# Patient Record
Sex: Male | Born: 1945 | Race: White | Hispanic: No | Marital: Married | Smoking: Former smoker
Health system: Southern US, Community
[De-identification: ages and names within clinical notes are randomized; demographics above are authoritative.]

## PROBLEM LIST (undated history)

## (undated) DIAGNOSIS — N200 Calculus of kidney: Secondary | ICD-10-CM

## (undated) HISTORY — DX: Calculus of kidney: N20.0

## (undated) HISTORY — PX: VASECTOMY: SHX75

---

## 2010-05-24 HISTORY — PX: COLONOSCOPY: SHX174

## 2014-01-15 ENCOUNTER — Ambulatory Visit (INDEPENDENT_AMBULATORY_CARE_PROVIDER_SITE_OTHER): Payer: Medicare Other | Admitting: Family Medicine

## 2014-01-15 VITALS — BP 142/86 | HR 67 | Temp 98.2°F | Resp 16 | Ht 69.0 in | Wt 191.0 lb

## 2014-01-15 DIAGNOSIS — Z13220 Encounter for screening for lipoid disorders: Secondary | ICD-10-CM

## 2014-01-15 DIAGNOSIS — Z0000 Encounter for general adult medical examination without abnormal findings: Secondary | ICD-10-CM

## 2014-01-15 DIAGNOSIS — Z23 Encounter for immunization: Secondary | ICD-10-CM

## 2014-01-15 DIAGNOSIS — Z131 Encounter for screening for diabetes mellitus: Secondary | ICD-10-CM

## 2014-01-15 DIAGNOSIS — Z111 Encounter for screening for respiratory tuberculosis: Secondary | ICD-10-CM

## 2014-01-15 DIAGNOSIS — Z125 Encounter for screening for malignant neoplasm of prostate: Secondary | ICD-10-CM

## 2014-01-15 LAB — COMPREHENSIVE METABOLIC PANEL
ALT: 33 U/L (ref 0–53)
AST: 24 U/L (ref 0–37)
Albumin: 4.1 g/dL (ref 3.5–5.2)
Alkaline Phosphatase: 90 U/L (ref 39–117)
BILIRUBIN TOTAL: 0.7 mg/dL (ref 0.2–1.2)
BUN: 16 mg/dL (ref 6–23)
CO2: 24 mEq/L (ref 19–32)
Calcium: 9 mg/dL (ref 8.4–10.5)
Chloride: 105 mEq/L (ref 96–112)
Creat: 0.97 mg/dL (ref 0.50–1.35)
Glucose, Bld: 106 mg/dL — ABNORMAL HIGH (ref 70–99)
Potassium: 4 mEq/L (ref 3.5–5.3)
SODIUM: 137 meq/L (ref 135–145)
TOTAL PROTEIN: 6.6 g/dL (ref 6.0–8.3)

## 2014-01-15 LAB — POCT UA - MICROSCOPIC ONLY
Bacteria, U Microscopic: NEGATIVE
CASTS, UR, LPF, POC: NEGATIVE
CRYSTALS, UR, HPF, POC: NEGATIVE
EPITHELIAL CELLS, URINE PER MICROSCOPY: NEGATIVE
RBC, urine, microscopic: NEGATIVE
Yeast, UA: NEGATIVE

## 2014-01-15 LAB — POCT CBC
Granulocyte percent: 66.6 %G (ref 37–80)
HCT, POC: 46.1 % (ref 43.5–53.7)
Hemoglobin: 15.5 g/dL (ref 14.1–18.1)
LYMPH, POC: 1.3 (ref 0.6–3.4)
MCH, POC: 32.7 pg — AB (ref 27–31.2)
MCHC: 33.6 g/dL (ref 31.8–35.4)
MCV: 97.2 fL — AB (ref 80–97)
MID (cbc): 0.5 (ref 0–0.9)
MPV: 12 fL (ref 0–99.8)
POC GRANULOCYTE: 3.7 (ref 2–6.9)
POC LYMPH PERCENT: 24 %L (ref 10–50)
POC MID %: 9.4 % (ref 0–12)
Platelet Count, POC: 122 10*3/uL — AB (ref 142–424)
RBC: 4.74 M/uL (ref 4.69–6.13)
RDW, POC: 12.7 %
WBC: 5.5 10*3/uL (ref 4.6–10.2)

## 2014-01-15 LAB — LIPID PANEL
CHOL/HDL RATIO: 3.9 ratio
Cholesterol: 135 mg/dL (ref 0–200)
HDL: 35 mg/dL — ABNORMAL LOW (ref >39)
LDL CALC: 86 mg/dL (ref 0–99)
TRIGLYCERIDES: 71 mg/dL (ref <150)
VLDL: 14 mg/dL (ref 0–40)

## 2014-01-15 LAB — POCT URINALYSIS DIPSTICK
Bilirubin, UA: NEGATIVE
Glucose, UA: NEGATIVE
Ketones, UA: NEGATIVE
Leukocytes, UA: NEGATIVE
Nitrite, UA: NEGATIVE
PROTEIN UA: NEGATIVE
RBC UA: NEGATIVE
SPEC GRAV UA: 1.02
UROBILINOGEN UA: 0.2
pH, UA: 5.5

## 2014-01-15 LAB — POCT GLYCOSYLATED HEMOGLOBIN (HGB A1C): Hemoglobin A1C: 4.9

## 2014-01-15 LAB — TSH: TSH: 3.417 u[IU]/mL (ref 0.350–4.500)

## 2014-01-15 LAB — PSA: PSA: 0.96 ng/mL (ref <=4.00)

## 2014-01-15 MED ORDER — ZOSTER VACCINE LIVE 19400 UNT/0.65ML ~~LOC~~ SOLR: 0.6500 mL | Freq: Once | SUBCUTANEOUS | Status: DC

## 2014-01-15 NOTE — Progress Notes (Addendum)
Subjective:  This chart was scribed for Stephen Chick, MD by [name], ED Scribe. This patient was seen in room 13 and the patient's care was started 1:54 PM.   Patient ID: Stephen Pugh, male    DOB: 1945/12/20, 68 y.o.   MRN: [id number]  HPI HPI Comments: AZEEZ Pugh is a 68 y.o. male who presents to [facility] for a physical examination today. Pt states he will be volunteering at a camp and needs the Hepatitis B vaccine, tetanus booster, and tuberculosis testing. He states his last physical was 2 years ago with [name].   He has not received the pneumonia or shingles vaccine. He has received the flu vaccine this year.   He had a colonoscopy without polyps about 5 years ago. Buccini; repeat in 10 years.  He has been seen by an optometrist and he denies glaucoma or cataracts. He regularly sees a Education officer, community.  History of vasectomy.   Mother passed at age 33 from cardiac disease. Father passed at age 33-73 from MI. One brother is 26 with a history of high cholesterol, CABG. Second brother and sister are fairly healthy. He denies family history of colon CA.   Patient has been married for 44 years. He has 3 children and 6 grandchildren. He manages a car wash. He smoked from age 33 and quit at age 64. He occasionally drinks alcohol. Pt states he stays physically active. He always wears a seatbelt. He does not wear sunscreen regularly. Pt states he snores while sleeping and awakens feeling refreshed. He goes to sleep at 10 pm and has been waking earlier than his set alarms. He denies emotional problems such as sadness or anger. He denies weak urine stream, erectile dysfunction, or decreased sex drive.   He denies having hearing trouble or hearing loss, dizziness, lightheadedness, blurred or double vision, neck swelling, chest pain, palpitations, SOB, cough, extremity numbness or tingling, neck pain, back pain, constipation, abdominal pain, urinary frequency, testicular pain or swelling.     Tuberculosis Risk Questionnaire  1. No Were you born outside the [place] in one of the following parts of the world: [place], [place], [place], [place] or [place]?    2. No Have you traveled outside the [place] and lived for more than one month in one of the following parts of the world: [place], [place], [place], [place] or [place]?    3. No Do you have a compromised immune system such as from any of the following conditions:HIV/AIDS, organ or bone marrow transplantation, diabetes, immunosuppressive medicines (e.g. Prednisone, Remicaide), leukemia, lymphoma, cancer of the head or neck, gastrectomy or jejunal bypass, end-stage renal disease (on dialysis), or silicosis?     4. No Have you ever or do you plan on working in: a residential care center, a health care facility, a jail or prison or homeless shelter?    5. No Have you ever: injected illegal drugs, used crack cocaine, lived in a homeless shelter  or been in jail or prison?     6. No Have you ever been exposed to anyone with infectious tuberculosis?    Tuberculosis Symptom Questionnaire  Do you currently have any of the following symptoms?  1. No Unexplained cough lasting more than 3 weeks?   2. No Unexplained fever lasting more than 3 weeks.   3. No Night Sweats (sweating that leaves the bedclothes and sheets wet)     4. No Shortness of Breath   5. No Chest  Pain   6. No Unintentional weight loss    7. No Unexplained fatigue (very tired for no reason)    Review of Systems  Constitutional: Negative.   HENT: Negative for ear pain, hearing loss, postnasal drip, rhinorrhea, sinus pressure, sneezing, sore throat, tinnitus, trouble swallowing and voice change.   Eyes: Negative for redness and visual disturbance.  Respiratory: Negative for apnea, cough, shortness of breath, wheezing and stridor.   Cardiovascular: Negative for chest pain, palpitations and leg swelling.  Gastrointestinal:  Negative for nausea, vomiting, abdominal pain, diarrhea, constipation, blood in stool, abdominal distention, anal bleeding and rectal pain.  Endocrine: Negative.   Genitourinary: Negative for urgency, frequency, hematuria, flank pain, decreased urine volume, penile swelling, scrotal swelling, penile pain and testicular pain.  Musculoskeletal: Negative for arthralgias, back pain, gait problem, joint swelling, myalgias, neck pain and neck stiffness.  Skin: Negative for color change, pallor, rash and wound.  Neurological: Negative for dizziness, tremors, seizures, syncope, facial asymmetry, speech difficulty, weakness, light-headedness, numbness and headaches.  Hematological: Negative for adenopathy. Does not bruise/bleed easily.  Psychiatric/Behavioral: Negative for sleep disturbance, self-injury and dysphoric mood. The patient is not nervous/anxious.    Past Medical History  Diagnosis Date  . Kidney stones    Past Surgical History  Procedure Laterality Date  . Vasectomy    . Colonoscopy  05/24/2010    diverticulosis.  Buccini. Repeat 10 years.   No Known Allergies  History   Social History  . Marital Status: Married    Spouse Name: N/A    Number of Children: 3  . Years of Education: N/A   Occupational History  . SITE MANAGER     car wash   Social History Main Topics  . Smoking status: Former Smoker -- 1.00 packs/day for 25 years  . Smokeless tobacco: Never Used  . Alcohol Use: Yes  . Drug Use: No  . Sexual Activity: Yes   Other Topics Concern  . Not on file   Social History Narrative   Marital status: married x 44 years.      Children: 3 children; 6 grandchildren.      Lives: with wife.      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      Tobacco: former smoker; smoked x 25 years.      Alcohol: 1-2 drinks per week.      Exercise; physical job; no formal exercise program.      Seatbelts: 100%      Sunscreen: rarely    History  Substance Use Topics  .  Smoking status: Former Smoker -- 1.00 packs/day for 25 years  . Smokeless tobacco: Never Used  . Alcohol Use: Yes   Family History  Problem Relation Age of Onset  . Hypertension Mother   . Diabetes Father   . Heart disease Brother 72    CAD/CABT  . Hyperlipidemia Brother        Objective:   Physical Exam  Nursing note and vitals reviewed. Constitutional: He is oriented to person, place, and time. He appears well-developed and well-nourished. No distress.  HENT:  Head: Normocephalic and atraumatic.  Right Ear: External ear normal.  Left Ear: External ear normal.  Nose: Nose normal.  Mouth/Throat: Oropharynx is clear and moist.  Eyes: Conjunctivae and EOM are normal. Pupils are equal, round, and reactive to light.  Neck: Normal range of motion. Neck supple. No JVD present. No tracheal deviation present. No thyromegaly present.  Cardiovascular: Normal rate, regular rhythm, normal  heart sounds and intact distal pulses.  Exam reveals no gallop and no friction rub.   No murmur heard. Pulmonary/Chest: Effort normal and breath sounds normal. No stridor. No respiratory distress. He has no wheezes. He has no rales. He exhibits no tenderness.  Abdominal: Soft. Bowel sounds are normal. He exhibits no distension and no mass. There is no tenderness. There is no rebound and no guarding. Hernia confirmed negative in the right inguinal area and confirmed negative in the left inguinal area.  Genitourinary: Rectum normal, prostate normal, testes normal and penis normal. Right testis shows no mass, no swelling and no tenderness. Left testis shows no mass, no swelling and no tenderness. Circumcised.  Musculoskeletal: Normal range of motion. He exhibits no edema and no tenderness.  Lymphadenopathy:    He has no cervical adenopathy.       Right: No inguinal adenopathy present.       Left: No inguinal adenopathy present.  Neurological: He is alert and oriented to person, place, and time. He has normal  reflexes. He displays normal reflexes.  Skin: Skin is warm and dry. He is not diaphoretic.  Psychiatric: He has a normal mood and affect. His behavior is normal. Judgment and thought content normal.    Filed Vitals:   01/15/14 0834  BP: 142/86  Pulse: 67  Temp: 98.2 F (36.8 C)  TempSrc: Oral  Resp: 16  Height: 5\' 9"  (1.753 m)  Weight: 191 lb (86.637 kg)  SpO2: 99%    EKG: NSR  HEPATITIS B#1 ADMINISTERED.  TB SKIN TEST PLACED BY FABIOLA.    Assessment & Plan:   1. Routine general medical examination at a health care facility : Anticipatory guidance.  Recommend ASA 81mg  one tablet daily.  Colonoscopy UTD.  S/p Hepatitis B#1; information on Pneumovax provided; rx for Zostava provided.  Obtain labs.  2. Need for prophylactic vaccination and inoculation against viral hepatitis : s/p Hepatitis B#1; RTC one month for Hepatitis B#2.  3. Tuberculosis screening : Tb skin test placed; RTC 48-72 hours for read.  4. Need for Zostavax administration : rrx for Zostavax provided.  5. Prostate cancer screening :discussed in detail at visit; DRE performed; obtain PSA.  6. Screening for diabetes mellitus : obtain glucose, HgbA1c.  7. Screening for hyperlipidemia : obtain FLP.    Nilda Simmer, M.D.  [facility] [address] [place], [place]  [address] [phone] phone [phone] fax  I personally performed the services described in this documentation, which was scribed in my presence.  The recorded information has been reviewed and is accurate.

## 2014-01-15 NOTE — Patient Instructions (Signed)
1. RETURN IN 48-7 HOURS FOR TB SKIN TEST READING.  Pneumococcal Vaccine, Polyvalent solution for injection What is this medicine? PNEUMOCOCCAL VACCINE, POLYVALENT (NEU mo KOK al vak SEEN, pol ee VEY luhnt) is a vaccine to prevent pneumococcus bacteria infection. These bacteria are a major cause of ear infections, Strep throat infections, and serious pneumonia, meningitis, or blood infections worldwide. These vaccines help the body to produce antibodies (protective substances) that help your body defend against these bacteria. This vaccine is recommended for people 752 years of age and older with health problems. It is also recommended for all adults over 66 years old. This vaccine will not treat an infection. This medicine may be used for other purposes; ask your health care provider or pharmacist if you have questions. COMMON BRAND NAME(S): Pneumovax 23 What should I tell my health care provider before I take this medicine? They need to know if you have any of these conditions: -bleeding problems -bone marrow or organ transplant -cancer, Hodgkin's disease -fever -infection -immune system problems -low platelet count in the blood -seizures -an unusual or allergic reaction to pneumococcal vaccine, diphtheria toxoid, other vaccines, latex, other medicines, foods, dyes, or preservatives -pregnant or trying to get pregnant -breast-feeding How should I use this medicine? This vaccine is for injection into a muscle or under the skin. It is given by a health care professional. A copy of Vaccine Information Statements will be given before each vaccination. Read this sheet carefully each time. The sheet may change frequently. Talk to your pediatrician regarding the use of this medicine in children. While this drug may be prescribed for children as young as 462 years of age for selected conditions, precautions do apply. Overdosage: If you think you have taken too much of this medicine contact a poison  control center or emergency room at once. NOTE: This medicine is only for you. Do not share this medicine with others. What if I miss a dose? It is important not to miss your dose. Call your doctor or health care professional if you are unable to keep an appointment. What may interact with this medicine? -medicines for cancer chemotherapy -medicines that suppress your immune function -medicines that treat or prevent blood clots like warfarin, enoxaparin, and dalteparin -steroid medicines like prednisone or cortisone This list may not describe all possible interactions. Give your health care provider a list of all the medicines, herbs, non-prescription drugs, or dietary supplements you use. Also tell them if you smoke, drink alcohol, or use illegal drugs. Some items may interact with your medicine. What should I watch for while using this medicine? Mild fever and pain should go away in 3 days or less. Report any unusual symptoms to your doctor or health care professional. What side effects may I notice from receiving this medicine? Side effects that you should report to your doctor or health care professional as soon as possible: -allergic reactions like skin rash, itching or hives, swelling of the face, lips, or tongue -breathing problems -confused -fever over 102 degrees F -pain, tingling, numbness in the hands or feet -seizures -unusual bleeding or bruising -unusual muscle weakness Side effects that usually do not require medical attention (report to your doctor or health care professional if they continue or are bothersome): -aches and pains -diarrhea -fever of 102 degrees F or less -headache -irritable -loss of appetite -pain, tender at site where injected -trouble sleeping This list may not describe all possible side effects. Call your doctor for medical advice about side effects.  You may report side effects to FDA at 1-800-FDA-1088. Where should I keep my medicine? This does  not apply. This vaccine is given in a clinic, pharmacy, doctor's office, or other health care setting and will not be stored at home. NOTE: This sheet is a summary. It may not cover all possible information. If you have questions about this medicine, talk to your doctor, pharmacist, or health care provider.  2014, Elsevier/Gold Standard. (2008-06-27 14:32:37)

## 2014-01-17 ENCOUNTER — Ambulatory Visit (INDEPENDENT_AMBULATORY_CARE_PROVIDER_SITE_OTHER): Payer: 59

## 2014-01-17 DIAGNOSIS — Z111 Encounter for screening for respiratory tuberculosis: Secondary | ICD-10-CM

## 2014-01-17 DIAGNOSIS — Z09 Encounter for follow-up examination after completed treatment for conditions other than malignant neoplasm: Secondary | ICD-10-CM

## 2014-01-17 LAB — TB SKIN TEST
Induration: 0 mm
TB Skin Test: NEGATIVE

## 2014-01-20 ENCOUNTER — Encounter: Payer: Self-pay | Admitting: Family Medicine

## 2014-02-15 ENCOUNTER — Ambulatory Visit (INDEPENDENT_AMBULATORY_CARE_PROVIDER_SITE_OTHER): Payer: Medicare Other | Admitting: *Deleted

## 2014-02-15 DIAGNOSIS — Z23 Encounter for immunization: Secondary | ICD-10-CM

## 2014-02-15 NOTE — Progress Notes (Signed)
   Subjective:    Patient ID: Stephen Pugh, male    DOB: 02/08/1946, 68 y.o.   MRN: [id number]  HPI Pt here for 2nd hep b series   Review of Systems     Objective:   Physical Exam        Assessment & Plan:

## 2014-02-26 ENCOUNTER — Ambulatory Visit: Payer: Self-pay | Admitting: Family Medicine

## 2014-04-16 ENCOUNTER — Ambulatory Visit: Payer: Medicare Other

## 2014-04-16 ENCOUNTER — Ambulatory Visit (INDEPENDENT_AMBULATORY_CARE_PROVIDER_SITE_OTHER): Payer: Medicare Other | Admitting: Family Medicine

## 2014-04-16 ENCOUNTER — Ambulatory Visit: Payer: Self-pay

## 2014-04-16 VITALS — BP 132/80 | HR 78 | Temp 99.0°F | Resp 17 | Ht 71.5 in | Wt 194.0 lb

## 2014-04-16 DIAGNOSIS — S8010XA Contusion of unspecified lower leg, initial encounter: Secondary | ICD-10-CM

## 2014-04-16 DIAGNOSIS — M79609 Pain in unspecified limb: Secondary | ICD-10-CM

## 2014-04-16 DIAGNOSIS — L02419 Cutaneous abscess of limb, unspecified: Secondary | ICD-10-CM

## 2014-04-16 DIAGNOSIS — L03115 Cellulitis of right lower limb: Secondary | ICD-10-CM

## 2014-04-16 DIAGNOSIS — S8011XA Contusion of right lower leg, initial encounter: Secondary | ICD-10-CM

## 2014-04-16 DIAGNOSIS — M79661 Pain in right lower leg: Secondary | ICD-10-CM

## 2014-04-16 DIAGNOSIS — L03119 Cellulitis of unspecified part of limb: Secondary | ICD-10-CM

## 2014-04-16 MED ORDER — CEFTRIAXONE SODIUM 1 G IJ SOLR
1.0000 g | Freq: Once | INTRAMUSCULAR | Status: AC
  Administered 2014-04-16: 1 g via INTRAMUSCULAR

## 2014-04-16 MED ORDER — DOXYCYCLINE HYCLATE 100 MG PO CAPS: 100.0000 mg | ORAL_CAPSULE | Freq: Two times a day (BID) | ORAL | Status: DC

## 2014-04-16 NOTE — Patient Instructions (Signed)
Take the doxycycline as directed- take it with food and a glass of water.  If you are not getting better or if you are getting worse please come back and see us or call right away!

## 2014-04-16 NOTE — Progress Notes (Signed)
[facility] [address], [place] [id number] [phone]  Date:  04/16/2014   Name:  Stephen Pugh   DOB:  [birth year]   MRN:  [id number]  PCP:  Nilda SimmerSMITH,KRISTI, MD    Chief Complaint: Leg Pain and Hypertension   History of Present Illness:  Stephen Pugh is a 68 y.o. very pleasant male patient who presents with the following:  Here today with a problem with his right calf.  Also noted to have elevated BP at triage About 3.5 weeks ago he tripped getting off a boat and hurt his right lateral calf.  He had a lot of swelling and pain in the leg and bruising and swelling in the right ankel.  He used ice, rest and elevation, did not seek care but seemed to be improving.  He has now been home about 10 days and throught he was getting better- however this am the leg was painful again, he he noted some redness and pain with walking so he decided to come in  He has never been dx or on tx for BP.  His BP today is "higher than it has ever been."  He is not sure if there is a family history of HTN.    His last tetanus was in 2009.    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  He has not noted a fever or other systemic sx.  He is generally healthy    There are no active problems to display for this patient.   Past Medical History  Diagnosis Date  . Kidney stones     Past Surgical History  Procedure Laterality Date  . Vasectomy    . Colonoscopy  05/24/2010    diverticulosis.  Buccini. Repeat 10 years.    History  Substance Use Topics  . Smoking status: Former Smoker -- 1.00 packs/day for 25 years  . Smokeless tobacco: Never Used  . Alcohol Use: Yes    Family History  Problem Relation Age of Onset  . Hypertension Mother   . Diabetes Father   . Heart disease Brother 72    CAD/CABT  . Hyperlipidemia Brother     No Known Allergies  Medication list has been reviewed and updated.  Current Outpatient Prescriptions on File Prior to Visit  Medication Sig  Dispense Refill  . zoster vaccine live, PF, (ZOSTAVAX) [id number] UNT/0.65ML injection Inject 19,400 Units into the skin once.  0.65 mL  0   No current facility-administered medications on file prior to visit.    Review of Systems:  As per HPI- otherwise negative.   Physical Examination: Filed Vitals:   04/16/14 1157  BP: 166/98  Pulse: 78  Temp: 99 F (37.2 C)  Resp: 17   Filed Vitals:   04/16/14 1157  Height: 5' 11.5" (1.816 m)  Weight: 194 lb (87.998 kg)   Body mass index is 26.68 kg/(m^2). Ideal Body Weight: Weight in (lb) to have BMI = 25: 181.4  GEN: WDWN, NAD, Non-toxic, A & O x 3, looks well HEENT: Atraumatic, Normocephalic. Neck supple. No masses, No LAD. Ears and Nose: No external deformity. CV: RRR, No M/G/R. No JVD. No thrill. No extra heart sounds. PULM: CTA B, no wheezes, crackles, rhonchi. No retractions. No resp. distress. No accessory muscle use. EXTR: No c/c/e NEURO Normal gait.  PSYCH: Normally interactive. Conversant. Not depressed or anxious appearing.  Calm demeanor.  Right leg: he has a tender, warm, red area surrounding a mostly healed  abrasion on the lateral mid calf.  There is no fluctuance or thinning of the skin to suggest a drainable abscess.  The knee, ankle and foot are otherwise negative with normal joint ROM  [facility] reading (PRIMARY) by  Dr. Patsy Lageropland. Right tib/ fib: negative RIGHT TIBIA AND FIBULA - 2 VIEW  COMPARISON: None.  FINDINGS: There is no evidence of fracture or other focal bone lesions. Soft tissues are unremarkable.  IMPRESSION: Normal right tibia and fibula.  Assessment and Plan: Pain in right shin - Plan: DG Tibia/Fibula Right  Contusion of right leg - Plan: DG Tibia/Fibula Right  Cellulitis of right leg without foot - Plan: cefTRIAXone (ROCEPHIN) injection 1 g, doxycycline (VIBRAMYCIN) 100 MG capsule  Likely cellulitis of the right calf resulting for an injury nearly a month ago.  Rocephin IM today, then start  doxycycline.  Urged close follow-up if not better soon.  BP ok on recheck  Meds ordered this encounter  Medications  . aspirin 81 MG tablet    Sig: Take 81 mg by mouth daily.  . cefTRIAXone (ROCEPHIN) injection 1 g    Sig:     Order Specific Question:  Antibiotic Indication:    Answer:  Cellulitis  . doxycycline (VIBRAMYCIN) 100 MG capsule    Sig: Take 1 capsule (100 mg total) by mouth 2 (two) times daily.    Dispense:  20 capsule    Refill:  0     Signed Abbe AmsterdamJessica Copland, MD

## 2014-07-15 ENCOUNTER — Ambulatory Visit (INDEPENDENT_AMBULATORY_CARE_PROVIDER_SITE_OTHER): Payer: Medicare Other

## 2014-07-15 DIAGNOSIS — Z23 Encounter for immunization: Secondary | ICD-10-CM

## 2014-07-15 NOTE — Progress Notes (Signed)
   Subjective:    Patient ID: Stephen Pugh, male    DOB: February 07, 1946, 68 y.o.   MRN: [id number]  HPI Pt here for third Hep B vaccination  Authorized by Porfirio Oarhelle Jeffery   Review of Systems     Objective:   Physical Exam Administered Hep B vaccination       Assessment & Plan:

## 2014-11-14 ENCOUNTER — Telehealth: Payer: Self-pay

## 2014-11-14 NOTE — Telephone Encounter (Signed)
Spoke to patient regarding flu shot.  He states he had it in late September.

## 2015-01-23 ENCOUNTER — Encounter: Payer: Self-pay | Admitting: Family Medicine

## 2015-01-23 ENCOUNTER — Ambulatory Visit (INDEPENDENT_AMBULATORY_CARE_PROVIDER_SITE_OTHER): Payer: Medicare Other | Admitting: Family Medicine

## 2015-01-23 VITALS — BP 145/83 | HR 80 | Temp 98.4°F | Resp 16 | Ht 72.0 in | Wt 199.0 lb

## 2015-01-23 DIAGNOSIS — Z111 Encounter for screening for respiratory tuberculosis: Secondary | ICD-10-CM

## 2015-01-23 NOTE — Progress Notes (Signed)
   Subjective:    Patient ID: Stephen Pugh, male    DOB: 10-Feb-1946, 69 y.o.   MRN: [id number]  HPI    Review of Systems     Objective:   Physical Exam        Assessment & Plan:   Tuberculosis Risk Questionnaire  1. No Were you born outside the [place] in one of the following parts of the world: [place], [place], [place], [place] or [place]?    2. No Have you traveled outside the [place] and lived for more than one month in one of the following parts of the world: [place], [place], [place], [place] or [place]?    3. No Do you have a compromised immune system such as from any of the following conditions:HIV/AIDS, organ or bone marrow transplantation, diabetes, immunosuppressive medicines (e.g. Prednisone, Remicaide), leukemia, lymphoma, cancer of the head or neck, gastrectomy or jejunal bypass, end-stage renal disease (on dialysis), or silicosis?     4. No Have you ever or do you plan on working in: a residential care center, a health care facility, a jail or prison or homeless shelter?    5. No Have you ever: injected illegal drugs, used crack cocaine, lived in a homeless shelter  or been in jail or prison?     6. No Have you ever been exposed to anyone with infectious tuberculosis?    Tuberculosis Symptom Questionnaire  Do you currently have any of the following symptoms?  1. No Unexplained cough lasting more than 3 weeks?   2. No Unexplained fever lasting more than 3 weeks.   3. No Night Sweats (sweating that leaves the bedclothes and sheets wet)     4. No Shortness of Breath   5. No Chest Pain   6. No Unintentional weight loss    7. No Unexplained fatigue (very tired for no reason)

## 2015-01-26 ENCOUNTER — Ambulatory Visit (INDEPENDENT_AMBULATORY_CARE_PROVIDER_SITE_OTHER): Payer: Medicare Other | Admitting: *Deleted

## 2015-01-26 DIAGNOSIS — Z7689 Persons encountering health services in other specified circumstances: Secondary | ICD-10-CM

## 2015-01-26 DIAGNOSIS — Z111 Encounter for screening for respiratory tuberculosis: Secondary | ICD-10-CM

## 2015-01-26 LAB — TB SKIN TEST
INDURATION: 0 mm
TB Skin Test: NEGATIVE

## 2015-02-08 IMAGING — CR DG TIBIA/FIBULA 2V*R*
4 series · 4 of 4 positions shown · non-contrast
Comparison: None.

CLINICAL DATA: Right leg pain after fall.

EXAM:
RIGHT TIBIA AND FIBULA - 2 VIEW

[AP (1 of 2)]
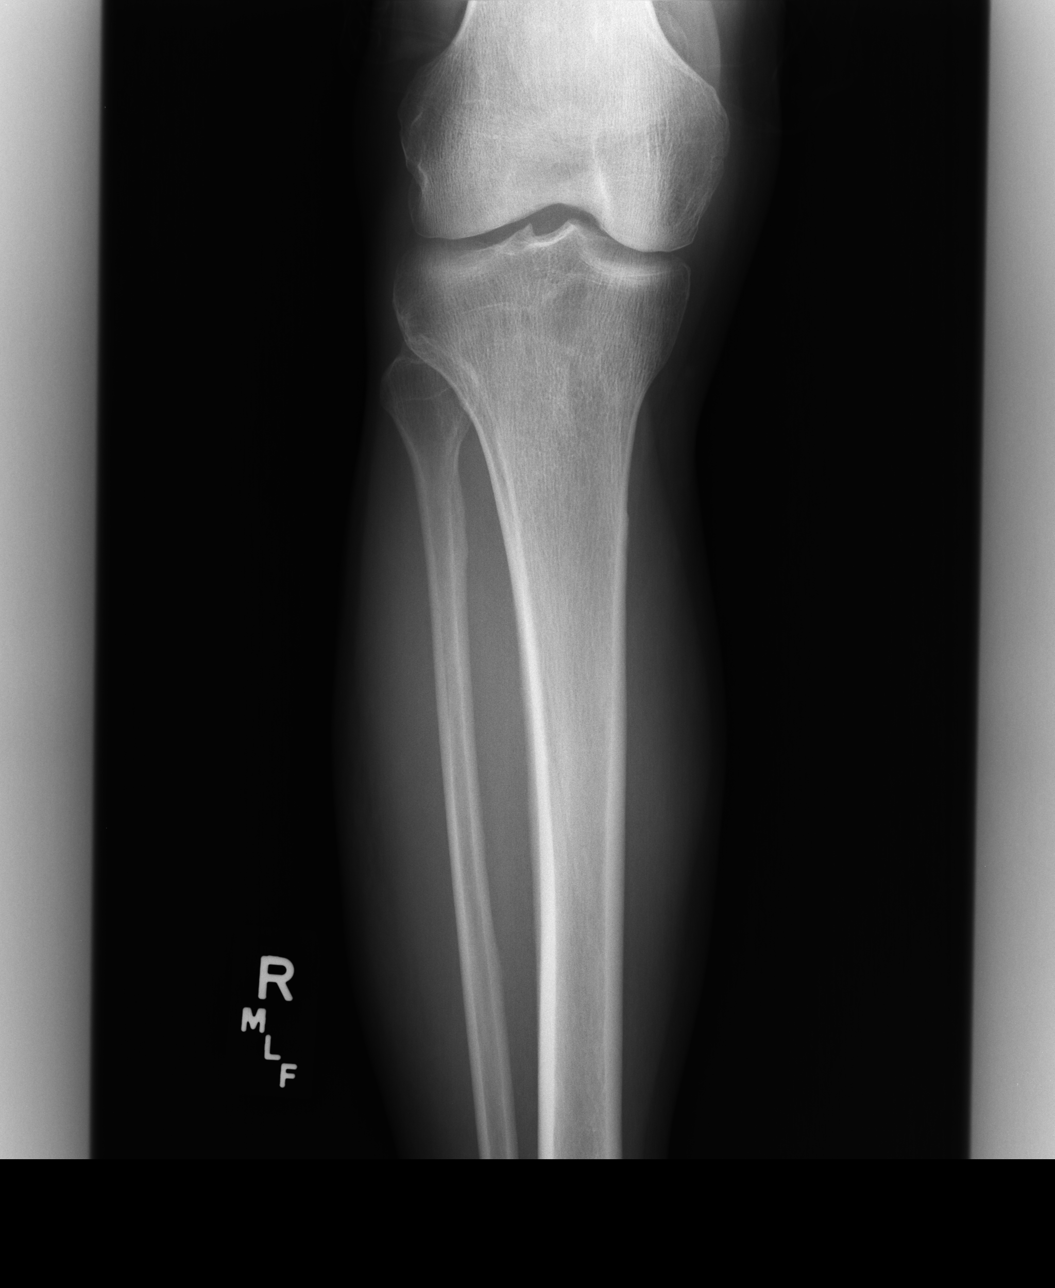

[AP (2 of 2)]
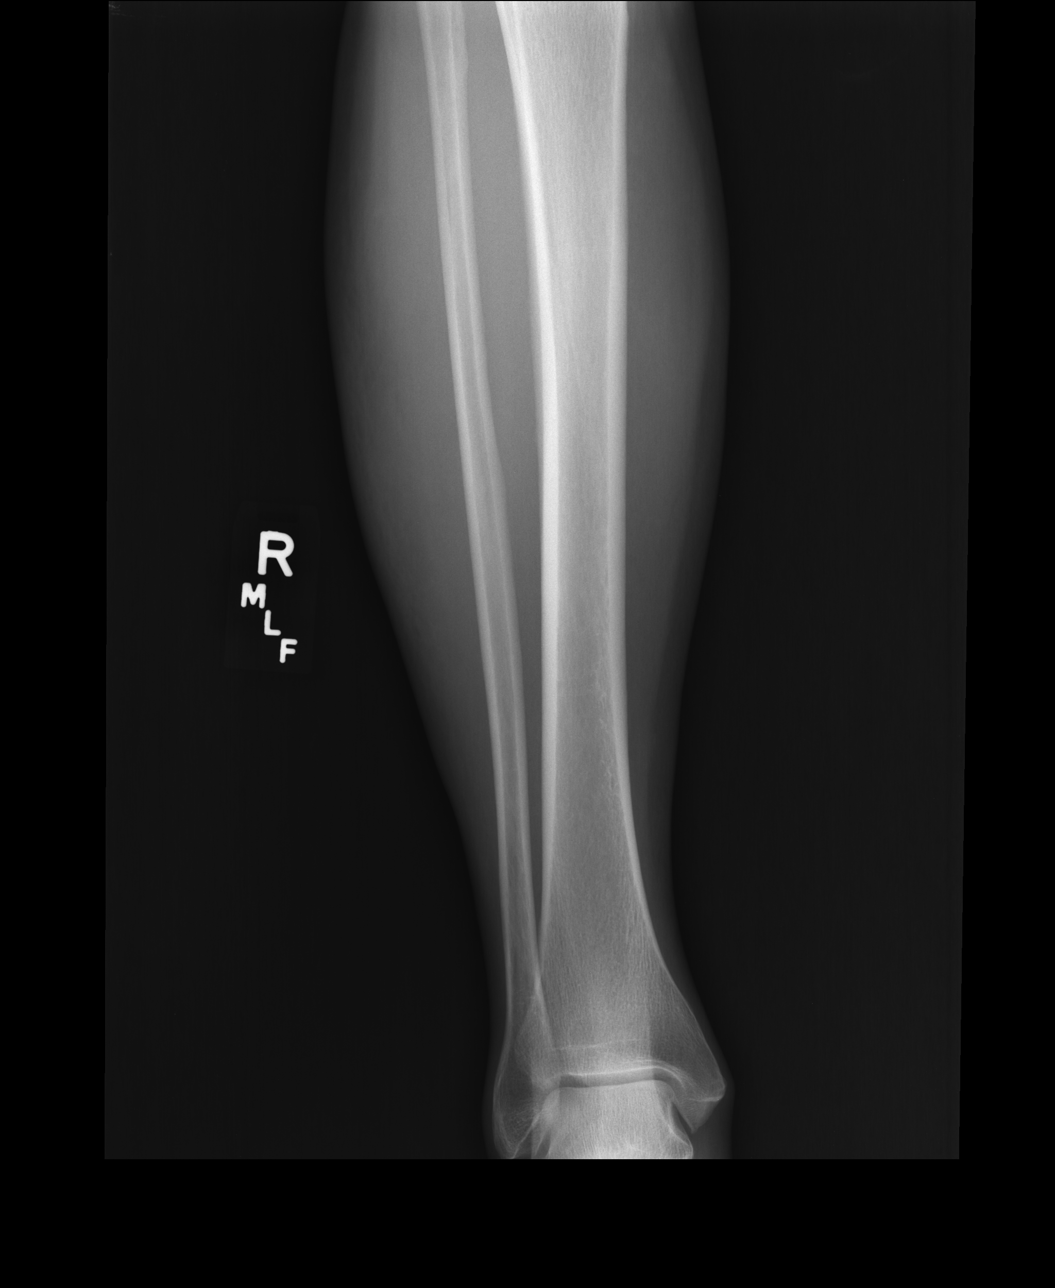

[lateral (1 of 2)]
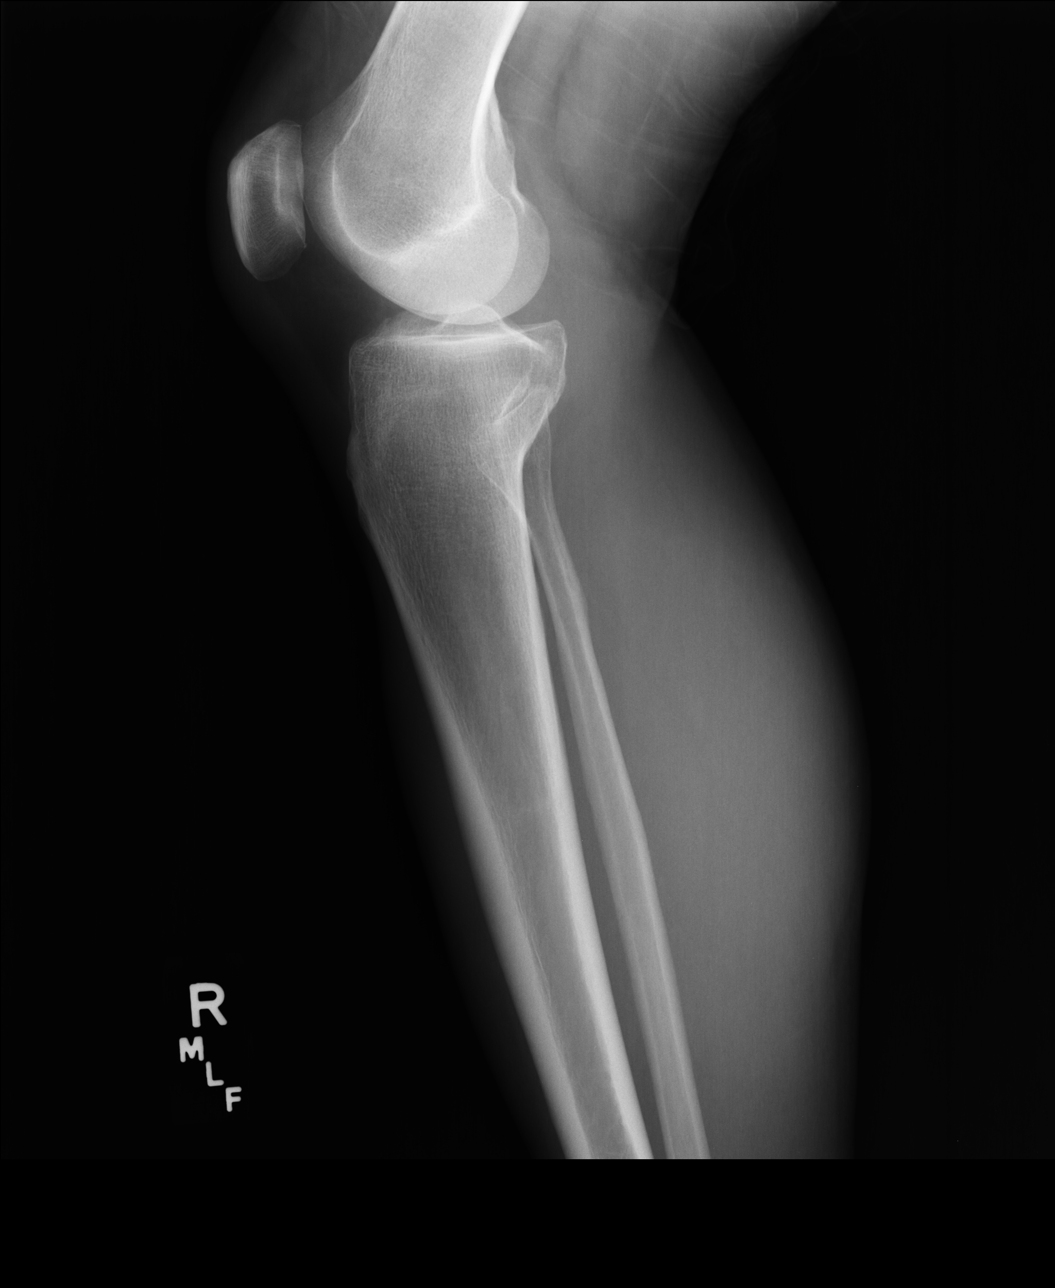

[lateral (2 of 2)]
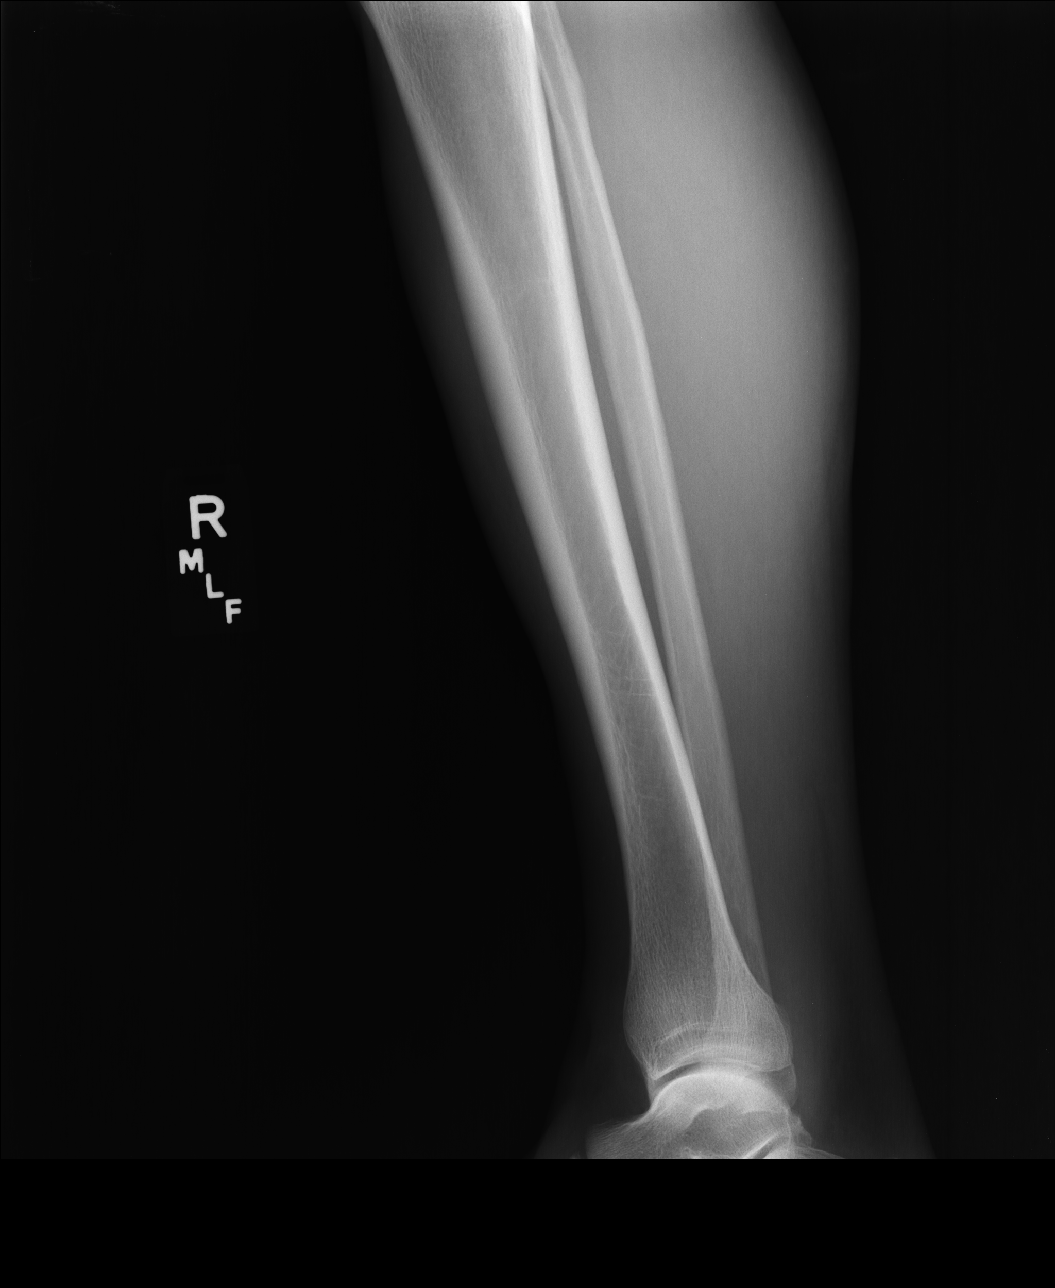

[4 of 4 positions shown; findings below may reference images not displayed]

FINDINGS: There is no evidence of fracture or other focal bone lesions. Soft
tissues are unremarkable.
IMPRESSION: Normal right tibia and fibula.

## 2015-02-08 NOTE — Progress Notes (Signed)
   Subjective:    Patient ID: Stephen Pugh, male    DOB: May 27, 1946, 69 y.o.   MRN: [id number] . Chief Complaint  Patient presents with  . 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  Has to have an annual tb test for this.  Otherwise is doing great. No complaints, no sxs.  Past Medical History  Diagnosis Date  . Kidney stones    Current Outpatient Prescriptions on File Prior to Visit  Medication Sig Dispense Refill  . aspirin 81 MG tablet Take 81 mg by mouth daily.    Marland Kitchen. doxycycline (VIBRAMYCIN) 100 MG capsule Take 1 capsule (100 mg total) by mouth 2 (two) times daily. (Patient not taking: Reported on 01/23/2015) 20 capsule 0  . zoster vaccine live, PF, (ZOSTAVAX) [id number] UNT/0.65ML injection Inject 19,400 Units into the skin once. (Patient not taking: Reported on 01/23/2015) 0.65 mL 0   No current facility-administered medications on file prior to visit.   No Known Allergies    Review of Systems  Constitutional: Negative for fever, chills, activity change, appetite change and unexpected weight change.  Respiratory: Negative for cough, chest tightness, shortness of breath and wheezing.   Gastrointestinal: Negative for nausea and vomiting.       Objective:   Physical Exam  Constitutional: He is oriented to person, place, and time. He appears well-developed and well-nourished. No distress.  HENT:  Head: Normocephalic and atraumatic.  Eyes: No scleral icterus.  Pulmonary/Chest: Effort normal.  Neurological: He is alert and oriented to person, place, and time.  Skin: Skin is warm and dry. He is not diaphoretic.  Psychiatric: He has a normal mood and affect. His behavior is normal.       Assessment & Plan:  Screening-pulmonary TB - Plan: TB Skin Test So he can volunteer at a camp for people with disabilites and illnesses. No h/o +.   Recheck in 48-72 hrs   Norberto SorensonEva Aadit Hagood, MD MPH

## 2015-02-11 ENCOUNTER — Ambulatory Visit (INDEPENDENT_AMBULATORY_CARE_PROVIDER_SITE_OTHER): Payer: Medicare Other | Admitting: Family Medicine

## 2015-02-11 ENCOUNTER — Encounter: Payer: Self-pay | Admitting: Family Medicine

## 2015-02-11 VITALS — BP 158/82 | HR 63 | Temp 98.2°F | Resp 16 | Ht 71.75 in | Wt 197.8 lb

## 2015-02-11 DIAGNOSIS — Z0000 Encounter for general adult medical examination without abnormal findings: Secondary | ICD-10-CM

## 2015-02-11 DIAGNOSIS — R030 Elevated blood-pressure reading, without diagnosis of hypertension: Secondary | ICD-10-CM | POA: Diagnosis not present

## 2015-02-11 DIAGNOSIS — R739 Hyperglycemia, unspecified: Secondary | ICD-10-CM | POA: Diagnosis not present

## 2015-02-11 DIAGNOSIS — Z23 Encounter for immunization: Secondary | ICD-10-CM

## 2015-02-11 DIAGNOSIS — Z125 Encounter for screening for malignant neoplasm of prostate: Secondary | ICD-10-CM | POA: Diagnosis not present

## 2015-02-11 DIAGNOSIS — D696 Thrombocytopenia, unspecified: Secondary | ICD-10-CM

## 2015-02-11 DIAGNOSIS — IMO0001 Reserved for inherently not codable concepts without codable children: Secondary | ICD-10-CM

## 2015-02-11 LAB — COMPREHENSIVE METABOLIC PANEL
ALBUMIN: 4.3 g/dL (ref 3.5–5.2)
ALK PHOS: 76 U/L (ref 39–117)
ALT: 42 U/L (ref 0–53)
AST: 28 U/L (ref 0–37)
BILIRUBIN TOTAL: 1.3 mg/dL — AB (ref 0.2–1.2)
BUN: 15 mg/dL (ref 6–23)
CALCIUM: 9.1 mg/dL (ref 8.4–10.5)
CHLORIDE: 105 meq/L (ref 96–112)
CO2: 22 mEq/L (ref 19–32)
Creat: 0.97 mg/dL (ref 0.50–1.35)
GLUCOSE: 81 mg/dL (ref 70–99)
POTASSIUM: 3.9 meq/L (ref 3.5–5.3)
Sodium: 139 mEq/L (ref 135–145)
TOTAL PROTEIN: 6.7 g/dL (ref 6.0–8.3)

## 2015-02-11 LAB — CBC WITH DIFFERENTIAL/PLATELET
BASOS ABS: 0.1 10*3/uL (ref 0.0–0.1)
Basophils Relative: 1 % (ref 0–1)
EOS ABS: 0.2 10*3/uL (ref 0.0–0.7)
Eosinophils Relative: 2 % (ref 0–5)
HCT: 43.2 % (ref 39.0–52.0)
Hemoglobin: 15.2 g/dL (ref 13.0–17.0)
LYMPHS ABS: 1.9 10*3/uL (ref 0.7–4.0)
Lymphocytes Relative: 24 % (ref 12–46)
MCH: 32.3 pg (ref 26.0–34.0)
MCHC: 35.2 g/dL (ref 30.0–36.0)
MCV: 91.9 fL (ref 78.0–100.0)
MPV: 12.2 fL (ref 8.6–12.4)
Monocytes Absolute: 0.7 10*3/uL (ref 0.1–1.0)
Monocytes Relative: 9 % (ref 3–12)
NEUTROS ABS: 5.1 10*3/uL (ref 1.7–7.7)
Neutrophils Relative %: 64 % (ref 43–77)
Platelets: 153 10*3/uL (ref 150–400)
RBC: 4.7 MIL/uL (ref 4.22–5.81)
RDW: 13.6 % (ref 11.5–15.5)
WBC: 7.9 10*3/uL (ref 4.0–10.5)

## 2015-02-11 NOTE — Patient Instructions (Addendum)
1. Start aspirin 81mg  one tablet daily. 2.  Recommend 10 pound weight loss. 3.  Start walking 2 miles daily. 4.  Check blood pressure every month at pharmacy. 5.  Recommend developing advanced directives (Living Will)  Keeping you healthy  Get these tests  Blood pressure- Have your blood pressure checked once a year by your healthcare provider.  Normal blood pressure is 120/80  Weight- Have your body mass index (BMI) calculated to screen for obesity.  BMI is a measure of body fat based on height and weight. You can also calculate your own BMI at [email or web address].[email or web address]/[email or web address]/.  Cholesterol- Have your cholesterol checked every year.  Diabetes- Have your blood sugar checked regularly if you have high blood pressure, high cholesterol, have a family history of diabetes or if you are overweight.  Screening for Colon Cancer- Colonoscopy starting at age 67.  Screening may begin sooner depending on your family history and other health conditions. Follow up colonoscopy as directed by your Gastroenterologist.  Screening for Prostate Cancer- Both blood work (PSA) and a rectal exam help screen for Prostate Cancer.  Screening begins at age 67 with African-American men and at age 67 with Caucasian men.  Screening may begin sooner depending on your family history.  Take these medicines  Aspirin- One aspirin daily can help prevent Heart disease and Stroke.  Flu shot- Every fall.  Tetanus- Every 10 years.  Zostavax- Once after the age of 67 to prevent Shingles.  Pneumonia shot- Once after the age of 67; if you are younger than 7165, ask your healthcare provider if you need a Pneumonia shot.  Take these steps  Don't smoke- If you do smoke, talk to your doctor about quitting.  For tips on how to quit, go to www.smokefree.gov or call 1-800-QUIT-NOW.  Be physically active- Exercise 5 days a week for at least 30 minutes.  If you are not already physically active start slow and gradually work up to 30 minutes  of moderate physical activity.  Examples of moderate activity include walking briskly, mowing the yard, dancing, swimming, bicycling, etc.  Eat a healthy diet- Eat a variety of healthy food such as fruits, vegetables, low fat milk, low fat cheese, yogurt, lean meant, poultry, fish, beans, tofu, etc. For more information go to www.thenutritionsource.org  Drink alcohol in moderation- Limit alcohol intake to less than two drinks a day. Never drink and drive.  Dentist- Brush and floss twice daily; visit your dentist twice a year.  Depression- Your emotional health is as important as your physical health. If you're feeling down, or losing interest in things you would normally enjoy please talk to your healthcare provider.  Eye exam- Visit your eye doctor every year.  Safe sex- If you may be exposed to a sexually transmitted infection, use a condom.  Seat belts- Seat belts can save your life; always wear one.  Smoke/Carbon Monoxide detectors- These detectors need to be installed on the appropriate level of your home.  Replace batteries at least once a year.  Skin cancer- When out in the sun, cover up and use sunscreen 15 SPF or higher.  Violence- If anyone is threatening you, please tell your healthcare provider.  Living Will/ Health care power of attorney- Speak with your healthcare provider and family.

## 2015-02-11 NOTE — Progress Notes (Signed)
Subjective:    Patient ID: Stephen Pugh, male    DOB: 09-17-1946, 69 y.o.   MRN: [id number]  02/11/2015  Annual Exam and Knee Pain   HPI This 69 y.o. male presents for Annual Wellness Examination.  Last physical:  01-15-2014 Colonoscopy:  2010; repeat in ten years.  Buccini. TDAP: 2009 Pneumovax: never; agreeable Zostavax: prescribed; declined.  Not interested.   Influenza:  09-03-14 Eye exam:  2015; no glaucoma or cataracts. Dental exam:  Every six months.   Hyperglycemia:  Present at CPE last year.  Working on exercise and weight loss.  Due for repeat labs.  Thrombocytopenia:  Present at last CPE one year ago.  Denies bruising or bleeding.  Denies epistaxis, gums bleeding, hematuria, or bloody stools.    R Knee pain:  Occurred two years ago; twinges of pain initially; recurrent symptoms; bought brace for two weeks with improvement.  Recurrent pain sporadically.  Now wearing knee brace for preventatively.    [facility]; children comes during weekends in Spring and Fall.  Started playing in garage.  Gardening.  Walking some.  Walking one mile twice weekly.    Review of Systems  Constitutional: Negative for fever, chills, diaphoresis, activity change, appetite change, fatigue and unexpected weight change.  HENT: Negative for congestion, dental problem, drooling, ear discharge, ear pain, facial swelling, hearing loss, mouth sores, nosebleeds, postnasal drip, rhinorrhea, sinus pressure, sneezing, sore throat, tinnitus, trouble swallowing and voice change.   Eyes: Negative for photophobia, pain, discharge, redness, itching and visual disturbance.  Respiratory: Negative for apnea, cough, choking, chest tightness, shortness of breath, wheezing and stridor.   Cardiovascular: Negative for chest pain, palpitations and leg swelling.  Gastrointestinal: Negative for nausea, vomiting, abdominal pain, diarrhea, constipation and blood in stool.  Endocrine: Negative for cold  intolerance, heat intolerance, polydipsia, polyphagia and polyuria.  Genitourinary: Negative for dysuria, urgency, frequency, hematuria, flank pain, decreased urine volume, discharge, penile swelling, scrotal swelling, enuresis, difficulty urinating, genital sores, penile pain and testicular pain.  Musculoskeletal: Positive for arthralgias. Negative for myalgias, back pain, joint swelling, gait problem, neck pain and neck stiffness.  Skin: Negative for color change, pallor, rash and wound.  Allergic/Immunologic: Negative for environmental allergies, food allergies and immunocompromised state.  Neurological: Negative for dizziness, tremors, seizures, syncope, facial asymmetry, speech difficulty, weakness, light-headedness, numbness and headaches.  Hematological: Negative for adenopathy. Does not bruise/bleed easily.  Psychiatric/Behavioral: Negative for suicidal ideas, hallucinations, behavioral problems, confusion, sleep disturbance, self-injury, dysphoric mood, decreased concentration and agitation. The patient is not nervous/anxious and is not hyperactive.     Past Medical History  Diagnosis Date  . Kidney stones    Past Surgical History  Procedure Laterality Date  . Vasectomy    . Colonoscopy  05/24/2010    diverticulosis.  Buccini. Repeat 10 years.   No Known Allergies Current Outpatient Prescriptions  Medication Sig Dispense Refill  . aspirin 81 MG tablet Take 81 mg by mouth daily.    Marland Kitchen. zoster vaccine live, PF, (ZOSTAVAX) [id number] UNT/0.65ML injection Inject 19,400 Units into the skin once. (Patient not taking: Reported on 01/23/2015) 0.65 mL 0   No current facility-administered medications for this visit.   History   Social History  . Marital Status: Married    Spouse Name: Stephen Pugh  . Number of Children: 3  . Years of Education: Stephen Pugh   Occupational History  . SITE MANAGER     [employer]   Social History Main Topics  . Smoking status: Former  Smoker -- 1.00 packs/day for 25 years  .  Smokeless tobacco: Never Used  . Alcohol Use: Yes  . Drug Use: No  . Sexual Activity: Yes   Other Topics Concern  . Not on file   Social History Narrative   Marital status: married x 45 years.      Children: 3 children; 6 grandchildren.      Lives: with wife.      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      Tobacco: former smoker; smoked x 25 years.      Alcohol: 2-3 drinks per week.      Exercise; physical job; no formal exercise program.      Seatbelts: 100%      Sunscreen: rarely      ADLs: independent with ADLs; no assistant devices.        Advanced Directives:  Has not completed.  FULL CODE; no prolonged measures.     Family History  Problem Relation Age of Onset  . Hypertension Mother   . Diabetes Father   . Heart disease Brother 72    CAD/CABG  . Hyperlipidemia Brother   . Hypertension Brother   . Alcohol abuse Sister         Objective:    BP 158/82 mmHg  Pulse 63  Temp(Src) 98.2 F (36.8 C) (Oral)  Resp 16  Ht 5' 11.75" (1.822 m)  Wt 197 lb 12.8 oz (89.721 kg)  BMI 27.03 kg/m2  SpO2 100% Physical Exam  Constitutional: He is oriented to person, place, and time. He appears well-developed and well-nourished. No distress.  HENT:  Head: Normocephalic and atraumatic.  Right Ear: External ear normal.  Left Ear: External ear normal.  Nose: Nose normal.  Mouth/Throat: Oropharynx is clear and moist.  Eyes: Conjunctivae and EOM are normal. Pupils are equal, round, and reactive to light.  Neck: Normal range of motion. Neck supple. Carotid bruit is not present. No thyromegaly present.  Cardiovascular: Normal rate, regular rhythm, normal heart sounds and intact distal pulses.  Exam reveals no gallop and no friction rub.   No murmur heard. Pulmonary/Chest: Effort normal and breath sounds normal. He has no wheezes. He has no rales.  Abdominal: Soft. Bowel sounds are normal. He exhibits no distension and no mass. There is no tenderness. There is no  rebound and no guarding. Hernia confirmed negative in the right inguinal area and confirmed negative in the left inguinal area.  Genitourinary: Rectum normal, prostate normal, testes normal and penis normal. Right testis shows no mass, no swelling and no tenderness. Left testis shows no mass, no swelling and no tenderness. Circumcised.  Musculoskeletal:       Right shoulder: Normal.       Left shoulder: Normal.       Cervical back: Normal.  Lymphadenopathy:    He has no cervical adenopathy.       Right: No inguinal adenopathy present.       Left: No inguinal adenopathy present.  Neurological: He is alert and oriented to person, place, and time. He has normal reflexes. No cranial nerve deficit. He exhibits normal muscle tone. Coordination normal.  Skin: Skin is warm and dry. No rash noted. He is not diaphoretic.  Psychiatric: He has a normal mood and affect. His behavior is normal. Judgment and thought content normal.   PREVNAR-13 ADMINISTERED.     Assessment & Plan:   1. Encounter for Medicare annual wellness exam   2. Thrombocytopenia  3. Hyperglycemia   4. Screening for prostate cancer   5. Blood pressure elevated   6. Need for pneumococcal vaccination     1.  Annual Wellness Examination and complete physical examination: anticipatory guidance provided --- weight loss, exercise, ASA  daily.  Colonoscopy UTD.  S/p Prevnar 13; pt declined Zostavax today.  Low fall risk. No hearing loss. NO URINARY INCONTINENCE.  No evidence of depression.  Does not have Living Will but plans to complete; desires full code. 2.  Thrombocytopenia: New.  Obtain CBC.  Asymptomatic. 3.  Hyperglycemia: present at CPE last year; obtain glucose, HgbA1c. Recommend weight loss, exercise, dietary modification. 4. Screening prostate cancer: s/p DRE; obtain PSA. 5.  Blood pressure elevated: recommend weight loss, exercise, low-sodium food choices; recommend checking BP once monthly.  RTC six months for BP  recheck. 6.  S/p Prevnar 13.   No orders of the defined types were placed in this encounter.    Return in about 6 months (around 08/14/2015) for recheck blood pressure.     Cortny Bambach Paulita Fujita, M.D. [facility] [address] [place], [place]  [address] [phone] phone [phone] fax

## 2015-02-12 LAB — PSA: PSA: 1.17 ng/mL (ref <=4.00)

## 2015-02-12 LAB — HEMOGLOBIN A1C
Hgb A1c MFr Bld: 5.3 % (ref <5.7)
Mean Plasma Glucose: 105 mg/dL (ref <117)

## 2015-03-11 ENCOUNTER — Ambulatory Visit (INDEPENDENT_AMBULATORY_CARE_PROVIDER_SITE_OTHER): Payer: Medicare Other | Admitting: Family Medicine

## 2015-03-11 VITALS — BP 132/82 | HR 64 | Temp 98.1°F | Resp 18 | Ht 71.5 in | Wt 194.0 lb

## 2015-03-11 DIAGNOSIS — M6530 Trigger finger, unspecified finger: Secondary | ICD-10-CM | POA: Diagnosis not present

## 2015-03-11 MED ORDER — TRIAMCINOLONE ACETONIDE 40 MG/ML IJ SUSP
40.0000 mg | Freq: Once | INTRAMUSCULAR | Status: AC
  Administered 2015-03-11: 10 mg via INTRAMUSCULAR

## 2015-03-11 NOTE — Patient Instructions (Signed)
Apply ice a couple of times today and tomorrow  Ibuprofen or Aleve if needed for pain  Wear the splint for 7-10 days. If not improving contact me and I will make referral to orthopedic hand  Return at any time if additional concern

## 2015-03-11 NOTE — Progress Notes (Signed)
Subjective: 69 year old retired gentleman who has been having a popping in his left thumb over the last several weeks. He noticed it when he awoke one morning, and it is continued to persist. No known trauma or excessive repetitive use that might have injured it. He does his yard work.  Objective: Healthy-appearing man in no major distress. He has a popping in the left thumb extensor tendon area, with the polyp palpable readily but cannot feel a defined nodule.  Assessment: Trigger finger left thumb  Plan: Discussed treatment options. Decided to go ahead and inject and splint it for a week or 10 days.  Procedure note: Area was prepped in a routine fashion. The area was anesthetized with ethyl chloride spray. Using a 27-gauge needle 10 mg of Kenalog with one fourth mL of 2% lidocaine were injected without difficulty. Patient tolerated the procedure well. Splint was applied. He has been instructed in care.  Plan: If he is doing well he will let me know. If he continues to have problems he is to contact me and I will go ahead and make a referral to orthopedic hand specialist without me needing to reexamine it.

## 2015-06-03 ENCOUNTER — Encounter: Payer: Self-pay | Admitting: Family Medicine

## 2015-08-17 ENCOUNTER — Encounter: Payer: Self-pay | Admitting: Family Medicine

## 2015-08-17 ENCOUNTER — Ambulatory Visit (INDEPENDENT_AMBULATORY_CARE_PROVIDER_SITE_OTHER): Payer: Medicare Other | Admitting: Family Medicine

## 2015-08-17 VITALS — BP 140/80 | HR 71 | Temp 97.4°F | Resp 16 | Ht 72.5 in | Wt 193.8 lb

## 2015-08-17 DIAGNOSIS — I10 Essential (primary) hypertension: Secondary | ICD-10-CM

## 2015-08-17 DIAGNOSIS — Z23 Encounter for immunization: Secondary | ICD-10-CM | POA: Diagnosis not present

## 2015-08-17 DIAGNOSIS — R0989 Other specified symptoms and signs involving the circulatory and respiratory systems: Secondary | ICD-10-CM

## 2015-08-17 NOTE — Patient Instructions (Signed)

## 2015-08-17 NOTE — Progress Notes (Signed)
Subjective:    Patient ID: Stephen Pugh, male    DOB: 08-26-1946, 69 y.o.   MRN: [id number]  08/17/2015  follow up blood pressure   HPI This 69 y.o. male presents for evaluation of elevated blood pressure.  Weight down seven pounds from last visit six months ago.  Today, L and R arm BP significantly different by 30 points. Yesterday at home, BP 128/78.  [facility]ing [facility] at [facility]; running 140-150s; doing the L arm at [facility].  One less than 140.  Has been doing some reading; s/p six month trial of weight loss, exercise, etc.  Started taking Magnesium nine days ago; not sure if very helpful.  Stephen Reges, MD wrote a book called the Magnesium Miracle. Taking a liquid magnesium Remag that is concentrated; eight ounce bottle is absorable at the cellular level without diuretic effect.  Recommends putting in water and sipping during the day.  Start out slowly at low doses and gradually increase.  Plans to work up to a teaspoon.  Exercising 3 days per week one hour; walking mostly.  Was going to the community center in [place].   Was doing some stationary bike some as well.    Brother with CABG; also had carotid bruit on R?  Has eaten poorly with high fat and high cholesterol diet.  Sedentary lifestyle; high stressful work environment.     Review of Systems  Constitutional: Negative for fever, chills, diaphoresis, activity change, appetite change and fatigue.  Eyes: Negative for visual disturbance.  Respiratory: Negative for cough and shortness of breath.   Cardiovascular: Negative for chest pain, palpitations and leg swelling.  Endocrine: Negative for cold intolerance, heat intolerance, polydipsia, polyphagia and polyuria.  Neurological: Negative for dizziness, tremors, seizures, syncope, facial asymmetry, speech difficulty, weakness, light-headedness, numbness and headaches.    Past Medical History  Diagnosis Date  . Kidney stones    Past Surgical History  Procedure Laterality Date  . Vasectomy      . Colonoscopy  05/24/2010    diverticulosis.  Buccini. Repeat 10 years.   No Known Allergies Current Outpatient Prescriptions  Medication Sig Dispense Refill  . aspirin 81 MG tablet Take 81 mg by mouth daily.     No current facility-administered medications for this visit.   Social History   Social History  . Marital Status: Married    Spouse Name: Stephen Pugh  . Number of Children: 3  . Years of Education: [place] Pugh   Occupational History  . SITE MANAGER     [employer]   Social History Main Topics  . Smoking status: Former Smoker -- 1.00 packs/day for 25 years  . Smokeless tobacco: Never Used  . Alcohol Use: Yes  . Drug Use: No  . Sexual Activity: Yes   Other Topics Concern  . Not on file   Social History Narrative   Marital status: married x 45 years.      Children: 3 children; 6 grandchildren.      Lives: with wife.      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      Tobacco: former smoker; smoked x 25 years.      Alcohol: 2-3 drinks per week.      Exercise; physical job; no formal exercise program.      Seatbelts: 100%      Sunscreen: rarely      ADLs: independent with ADLs; no assistant devices.        Advanced Directives:  Has not completed.  FULL CODE; no prolonged measures.     Family History  Problem Relation Age of Onset  . Hypertension Mother   . Diabetes Father   . Heart disease Brother 72    CAD/CABG  . Hyperlipidemia Brother   . Hypertension Brother   . Alcohol abuse Sister        Objective:    BP 140/80 mmHg  Pulse 71  Temp(Src) 97.4 F (36.3 C) (Oral)  Resp 16  Ht 6' 0.5" (1.842 m)  Wt 193 lb 12.8 oz (87.907 kg)  BMI 25.91 kg/m2 Physical Exam  Constitutional: He is oriented to person, place, and time. He appears well-developed and well-nourished. No distress.  HENT:  Head: Normocephalic and atraumatic.  Right Ear: External ear normal.  Left Ear: External ear normal.  Nose: Nose normal.  Mouth/Throat: Oropharynx is clear and moist.   Eyes: Conjunctivae and EOM are normal. Pupils are equal, round, and reactive to light.  Neck: Normal range of motion. Neck supple. Carotid bruit is not present. No thyromegaly present.  Cardiovascular: Normal rate, regular rhythm, normal heart sounds and intact distal pulses.  Exam reveals no gallop and no friction rub.   No murmur heard. Pulmonary/Chest: Effort normal and breath sounds normal. He has no wheezes. He has no rales.  Abdominal: Soft. Bowel sounds are normal. He exhibits no distension and no mass. There is no tenderness. There is no rebound and no guarding.  Lymphadenopathy:    He has no cervical adenopathy.  Neurological: He is alert and oriented to person, place, and time. No cranial nerve deficit.  Skin: Skin is warm and dry. No rash noted. He is not diaphoretic.  Psychiatric: He has a normal mood and affect. His behavior is normal.  Nursing note and vitals reviewed.  Results for orders placed or performed in visit on 02/11/15  CBC with Differential/Platelet  Result Value Ref Range   WBC 7.9 4.0 - 10.5 K/uL   RBC 4.70 4.22 - 5.81 MIL/uL   Hemoglobin 15.2 13.0 - 17.0 g/dL   HCT 43.2 39.0 - 52.0 %   MCV 91.9 78.0 - 100.0 fL   MCH 32.3 26.0 - 34.0 pg   MCHC 35.2 30.0 - 36.0 g/dL   RDW 13.6 11.5 - 15.5 %   Platelets 153 150 - 400 K/uL   MPV 12.2 8.6 - 12.4 fL   Neutrophils Relative % 64 43 - 77 %   Neutro Abs 5.1 1.7 - 7.7 K/uL   Lymphocytes Relative 24 12 - 46 %   Lymphs Abs 1.9 0.7 - 4.0 K/uL   Monocytes Relative 9 3 - 12 %   Monocytes Absolute 0.7 0.1 - 1.0 K/uL   Eosinophils Relative 2 0 - 5 %   Eosinophils Absolute 0.2 0.0 - 0.7 K/uL   Basophils Relative 1 0 - 1 %   Basophils Absolute 0.1 0.0 - 0.1 K/uL   Smear Review Criteria for review not met   Comprehensive metabolic panel  Result Value Ref Range   Sodium 139 135 - 145 mEq/L   Potassium 3.9 3.5 - 5.3 mEq/L   Chloride 105 96 - 112 mEq/L   CO2 22 19 - 32 mEq/L   Glucose, Bld 81 70 - 99 mg/dL   BUN 15 6  - 23 mg/dL   Creat 0.97 0.50 - 1.35 mg/dL   Total Bilirubin 1.3 (H) 0.2 - 1.2 mg/dL   Alkaline Phosphatase 76 39 - 117 U/L   AST 28 0 - 37 U/L  ALT 42 0 - 53 U/L   Total Protein 6.7 6.0 - 8.3 g/dL   Albumin 4.3 3.5 - 5.2 g/dL   Calcium 9.1 8.4 - 10.5 mg/dL  Hemoglobin A1c  Result Value Ref Range   Hgb A1c MFr Bld 5.3 <5.7 %   Mean Plasma Glucose 105 <117 mg/dL  PSA  Result Value Ref Range   PSA 1.17 <=4.00 ng/mL   BLOOD PRESSURE IN L ARM 162/86; BLOOD PRESSURE R ARM 140/80.  INFLUENZA VACCINE ADMINISTERED.    Assessment & Plan:   1. Essential hypertension   2. Influenza vaccine needed   3. Unequal blood pressure in upper extremities     1.  HTN: persistent; with blood pressure variation of arms of 22 points; refer to cardiology to rule out vascular pathology.  Pt declines antihypertensive medication at this time; desires four month trial of magnesium supplement and increasing exercise.   2. S/p flu vaccine.  Orders Placed This Encounter  Procedures  . Flu Vaccine QUAD 36+ mos IM  . Ambulatory referral to Cardiology    Referral Priority:  Routine    Referral Type:  Consultation    Referral Reason:  Specialty Services Required    Referred to Provider:  Adrian Prows, MD    Requested Specialty:  Cardiology    Number of Visits Requested:  1   No orders of the defined types were placed in this encounter.    Return in about 4 months (around 12/17/2015) for recheck BLOOD PRESSURE.   Vandell Kun Elayne Guerin, M.D. [facility] [address] [place], [place]  [address] [phone] phone [phone] fax

## 2015-09-07 DIAGNOSIS — I10 Essential (primary) hypertension: Secondary | ICD-10-CM | POA: Diagnosis not present

## 2015-12-21 ENCOUNTER — Ambulatory Visit: Payer: Self-pay | Admitting: Family Medicine

## 2017-10-06 ENCOUNTER — Telehealth: Payer: Self-pay

## 2017-10-06 NOTE — Telephone Encounter (Signed)
Called pt to schedule Medicare Annual Wellness Visit. [place] there. Declined to update address but gave permission to update PCP.    Sherle PoeNicole Conchetta Lamia, B.A.  Care Guide - [facility] at [facility] [phone]
